# Patient Record
Sex: Female | Born: 1955 | Race: White | Hispanic: No | Marital: Married
Health system: Southern US, Community
[De-identification: ages and names within clinical notes are randomized; demographics above are authoritative.]

## PROBLEM LIST (undated history)

## (undated) DIAGNOSIS — L659 Nonscarring hair loss, unspecified: Secondary | ICD-10-CM

---

## 2015-04-10 ENCOUNTER — Encounter (HOSPITAL_BASED_OUTPATIENT_CLINIC_OR_DEPARTMENT_OTHER): Payer: Self-pay | Admitting: Emergency Medicine

## 2015-04-10 ENCOUNTER — Emergency Department (HOSPITAL_BASED_OUTPATIENT_CLINIC_OR_DEPARTMENT_OTHER)
Admission: EM | Admit: 2015-04-10 | Discharge: 2015-04-10 | Disposition: A | Discharge status: Home / Self Care | Payer: BC Managed Care – PPO | Attending: Emergency Medicine | Admitting: Emergency Medicine

## 2015-04-10 DIAGNOSIS — M79639 Pain in unspecified forearm: Secondary | ICD-10-CM

## 2015-04-10 DIAGNOSIS — X58XXXA Exposure to other specified factors, initial encounter: Secondary | ICD-10-CM | POA: Diagnosis not present

## 2015-04-10 DIAGNOSIS — Y9289 Other specified places as the place of occurrence of the external cause: Secondary | ICD-10-CM | POA: Diagnosis not present

## 2015-04-10 DIAGNOSIS — Y9389 Activity, other specified: Secondary | ICD-10-CM | POA: Diagnosis not present

## 2015-04-10 DIAGNOSIS — Z79899 Other long term (current) drug therapy: Secondary | ICD-10-CM | POA: Diagnosis not present

## 2015-04-10 DIAGNOSIS — M79631 Pain in right forearm: Secondary | ICD-10-CM | POA: Diagnosis not present

## 2015-04-10 DIAGNOSIS — M542 Cervicalgia: Secondary | ICD-10-CM | POA: Diagnosis not present

## 2015-04-10 DIAGNOSIS — Y998 Other external cause status: Secondary | ICD-10-CM | POA: Diagnosis not present

## 2015-04-10 DIAGNOSIS — T7840XA Allergy, unspecified, initial encounter: Secondary | ICD-10-CM

## 2015-04-10 DIAGNOSIS — L298 Other pruritus: Secondary | ICD-10-CM | POA: Diagnosis present

## 2015-04-10 DIAGNOSIS — M79632 Pain in left forearm: Secondary | ICD-10-CM | POA: Insufficient documentation

## 2015-04-10 HISTORY — DX: Nonscarring hair loss, unspecified: L65.9

## 2015-04-10 MED ORDER — HYDROXYZINE HCL 25 MG PO TABS
50.0000 mg | ORAL_TABLET | Freq: Once | ORAL | Status: AC
  Administered 2015-04-10: 50 mg via ORAL
  Filled 2015-04-10: qty 2

## 2015-04-10 MED ORDER — HYDROXYZINE HCL 25 MG PO TABS: 25.0000 mg | ORAL_TABLET | Freq: Four times a day (QID) | ORAL | Status: AC

## 2015-04-10 MED ORDER — PREDNISONE 20 MG PO TABS: 40.0000 mg | ORAL_TABLET | Freq: Every day | ORAL | Status: AC

## 2015-04-10 MED ORDER — PREDNISONE 50 MG PO TABS
60.0000 mg | ORAL_TABLET | Freq: Once | ORAL | Status: AC
  Administered 2015-04-10: 60 mg via ORAL
  Filled 2015-04-10 (×2): qty 1

## 2015-04-10 NOTE — ED Notes (Signed)
Pt ambulating independently w/ steady gait on d/c in no acute distress, A&Ox4. D/c instructions reviewed w/ pt and family - pt and family deny any further questions or concerns at present. Rx given x2  

## 2015-04-10 NOTE — Discharge Instructions (Signed)
Allergies  Allergies may happen from anything your body is sensitive to. This may be food, medicines, pollens, chemicals, and many other things. Food allergies can be severe and deadly.  HOME CARE  If you do not know what causes a reaction, keep a diary. Write down the foods you ate and the symptoms that followed. Avoid foods that cause reactions.  If you have red raised spots (hives) or a rash:  Take medicine as told by your doctor.  Use medicines for red raised spots and itching as needed.  Apply cold cloths (compresses) to the skin. Take a cool bath. Avoid hot baths or showers.  If you are severely allergic:  It is often necessary to go to the hospital after you have treated your reaction.  Wear your medical alert jewelry.  You and your family must learn how to give a allergy shot or use an allergy kit (anaphylaxis kit).  Always carry your allergy kit or shot with you. Use this medicine as told by your doctor if a severe reaction is occurring. GET HELP RIGHT AWAY IF:  You have trouble breathing or are making high-pitched whistling sounds (wheezing).  You have a tight feeling in your chest or throat.  You have a puffy (swollen) mouth.  You have red raised spots, puffiness (swelling), or itching all over your body.  You have had a severe reaction that was helped by your allergy kit or shot. The reaction can return once the medicine has worn off.  You think you are having a food allergy. Symptoms most often happen within 30 minutes of eating a food.  Your symptoms have not gone away within 2 days or are getting worse.  You have new symptoms.  You want to retest yourself with a food or drink you think causes an allergic reaction. Only do this under the care of a doctor. MAKE SURE YOU:   Understand these instructions.  Will watch your condition.  Will get help right away if you are not doing well or get worse. Document Released: 12/20/2012 Document Reviewed:  12/20/2012 [facility] Patient Information 2015 [place]. This information is not intended to replace advice given to you by your health care provider. Make sure you discuss any questions you have with your health care provider.

## 2015-04-10 NOTE — ED Provider Notes (Signed)
CSN: [id number]     Arrival date & time 04/10/15  0201 History   First MD Initiated Contact with Patient 04/10/15 [phone]     Chief Complaint  Patient presents with  . Pruritis     (Consider location/radiation/quality/duration/timing/severity/associated sxs/prior Treatment) HPI Comments: Patient is a 59 year old healthy female who presents today with severe itching, burning and pain to bilateral dorsal forearms and cheeks. This started about 9:00 this evening and has persisted but waxes and wanes in severity. She attempted to take Benadryl use Sarna cream and wash with doesn't dial soap without any improvement. She has not noticed any rashes on the skin but does note some slight redness and swelling of her cheeks by her nose. She was washing windows today and she had her bilateral arms and a bucket of water and thinks there may have been insect repellent on the windows. She has never had a reaction like this in the past.  The history is provided by the patient.    Past Medical History  Diagnosis Date  . Alopecia    No past surgical history on file. No family history on file. History  Substance Use Topics  . Smoking status: Not on file  . Smokeless tobacco: Not on file  . Alcohol Use: Not on file   OB History    No data available     Review of Systems  All other systems reviewed and are negative.     Allergies  Erythromycin  Home Medications   Prior to Admission medications   Medication Sig Start Date End Date Taking? Authorizing Provider  finasteride (PROSCAR) 5 MG tablet Take 5 mg by mouth daily.   Yes Historical Provider, MD  fluocinolone (VANOS) 0.01 % cream Apply topically 2 (two) times daily.   Yes Historical Provider, MD  hydroxychloroquine (PLAQUENIL) 200 MG tablet Take 200 mg by mouth daily.   Yes Historical Provider, MD   BP 129/70 mmHg  Pulse 69  Temp(Src) 98 F (36.7 C) (Oral)  Resp 16  Ht 5\' 3"  (1.6 m)  Wt 154 lb (69.854 kg)  BMI 27.29 kg/m2  SpO2  100% Physical Exam  Constitutional: She is oriented to person, place, and time. She appears well-developed and well-nourished. No distress.  HENT:  Head: Normocephalic and atraumatic.  Mild swelling and redness of the cheeks near the nose. No mouth swelling tongue and uvula are within normal limits.  Eyes: EOM are normal. Pupils are equal, round, and reactive to light.  Neck: Spinous process tenderness present.    Minimal midline cervical tenderness  Cardiovascular: Normal rate and intact distal pulses.   Pulmonary/Chest: Effort normal.  Musculoskeletal: She exhibits no edema or tenderness.  Neurological: She is alert and oriented to person, place, and time.  5 out of 5 hand grip strength  Skin: Skin is warm and dry. No rash noted. No erythema.  Psychiatric: She has a normal mood and affect. Her behavior is normal.  Nursing note and vitals reviewed.   ED Course  Procedures (including critical care time) Labs Review Labs Reviewed - No data to display  Imaging Review No results found.   EKG Interpretation None      MDM   Final diagnoses:  Pain in forearm, unspecified laterality  Allergic reaction, initial encounter    Patient presenting today with the persistent itching, burning and stinging pain in the bilateral dorsal forearms as well as some symptoms in her face. This started at 9 PM and is not resolved despite trying [place]  cream, Benadryl and washing with various soaps. She has not noticed any rashes to the skin. She's never had symptoms like this before. She does note today she was washing windows for several hours outside and did come into contact with some type of insect repellent that was on the windows. She's concerned that that could be the cause of her symptoms. This is possible however patient does not have a rash on her forearms however the symptoms are only on the arms and the face. She thinks she was wiping her face with her hands while doing this activity.  It  is possible this is allergic reaction however patient also has known cervical disease with arthritis and its also possible that she has a mild bulging disc which is causing nerve pain and a sensation of itching in her forearms.  Patient given prednisone and Atarax    Gwyneth Sprout, MD 04/10/15 [phone]

## 2015-04-10 NOTE — ED Notes (Signed)
Pt reports that she started itching on face and bilateral arms around 2130. Reports helping friend was windows tonight with "norwex" around 1730 , pt came home took shower and around 2100 started itching, took benadryl at 2200 with no help

## 2015-04-10 NOTE — ED Notes (Signed)
Pt with redness to right cheek, c/o itching to bilateral arms, very anxious, denies sob, no difficulty talking or swallowing

## 2016-12-25 ENCOUNTER — Emergency Department (HOSPITAL_BASED_OUTPATIENT_CLINIC_OR_DEPARTMENT_OTHER)
Admission: EM | Admit: 2016-12-25 | Discharge: 2016-12-25 | Disposition: A | Discharge status: Home / Self Care | Payer: BC Managed Care – PPO | Attending: Emergency Medicine | Admitting: Emergency Medicine

## 2016-12-25 ENCOUNTER — Emergency Department (HOSPITAL_BASED_OUTPATIENT_CLINIC_OR_DEPARTMENT_OTHER): Payer: BC Managed Care – PPO

## 2016-12-25 DIAGNOSIS — M791 Myalgia: Secondary | ICD-10-CM | POA: Diagnosis not present

## 2016-12-25 DIAGNOSIS — M25551 Pain in right hip: Secondary | ICD-10-CM | POA: Diagnosis present

## 2016-12-25 DIAGNOSIS — M549 Dorsalgia, unspecified: Secondary | ICD-10-CM | POA: Insufficient documentation

## 2016-12-25 DIAGNOSIS — Z79899 Other long term (current) drug therapy: Secondary | ICD-10-CM | POA: Insufficient documentation

## 2016-12-25 DIAGNOSIS — R110 Nausea: Secondary | ICD-10-CM | POA: Diagnosis not present

## 2016-12-25 DIAGNOSIS — R52 Pain, unspecified: Secondary | ICD-10-CM

## 2016-12-25 MED ORDER — HYDROCODONE-ACETAMINOPHEN 5-325 MG PO TABS: 1.0000 | ORAL_TABLET | Freq: Four times a day (QID) | ORAL | 0 refills | Status: AC | PRN

## 2016-12-25 MED ORDER — HYDROCODONE-ACETAMINOPHEN 5-325 MG PO TABS
1.0000 | ORAL_TABLET | Freq: Once | ORAL | Status: AC
  Administered 2016-12-25: 1 via ORAL
  Filled 2016-12-25: qty 1

## 2016-12-25 NOTE — ED Notes (Signed)
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

## 2016-12-25 NOTE — ED Provider Notes (Signed)
[facility] Provider Note   CSN: [id number] Arrival date & time: 12/25/16  1913  By signing my name below, I, Pamela Jones, attest that this documentation has been prepared under the direction and in the presence of physician practitioner, Vanetta Mulders, MD. Electronically Signed: Linna Jones, Scribe. 12/25/2016. 9:50 PM.  History   Chief Complaint Chief Complaint  Patient presents with  . Hip Pain    The history is provided by the patient. No language interpreter was used.  Hip Pain  This is a new problem. The current episode started more than 1 week ago. The problem occurs constantly. The problem has been gradually worsening. Pertinent negatives include no chest pain, no abdominal pain, no headaches and no shortness of breath. The symptoms are aggravated by walking and standing. Nothing relieves the symptoms. She has tried nothing for the symptoms.     HPI Comments: Pamela Jones is a 61 y.o. female who presents to the Emergency Department complaining of constant, gradually worsening right hip pain for a couple of weeks. She reports associated nausea secondary to pain. Pt states her pain worsened significantly tonight while ambulating and notes she cannot currently ambulate due to the severity of her pain. She reports pain radiation down her right lower extremity. No alleviating factors noted. Pt received a steroid injection from her PCP on 4/10 with mild improvement of her hip pain. She also notes some pre-existing back pain that has been worse since onset of her hip pain. Pt denies numbness/tingling, fevers, chills, visual changes, cough, rhinorrhea, sore throat, CP, SOB, abdominal pain, vomiting, diarrhea, dysuria, joint swelling, rashes, headaches, or any other associated symptoms.  Past Medical History:  Diagnosis Date  . Alopecia     There are no active problems to display for this patient.   No past surgical history on file.  OB History    No data available         Home Medications    Prior to Admission medications   Medication Sig Start Date End Date Taking? Authorizing Provider  finasteride (PROSCAR) 5 MG tablet Take 5 mg by mouth daily.   Yes Historical Provider, MD  hydroxychloroquine (PLAQUENIL) 200 MG tablet Take 200 mg by mouth daily.   Yes Historical Provider, MD  fluocinolone (VANOS) 0.01 % cream Apply topically 2 (two) times daily.    Historical Provider, MD  HYDROcodone-acetaminophen (NORCO/VICODIN) 5-325 MG tablet Take 1-2 tablets by mouth every 6 (six) hours as needed for moderate pain. 12/25/16   Vanetta Mulders, MD  hydrOXYzine (ATARAX/VISTARIL) 25 MG tablet Take 1 tablet (25 mg total) by mouth every 6 (six) hours. 04/10/15   Gwyneth Sprout, MD  predniSONE (DELTASONE) 20 MG tablet Take 2 tablets (40 mg total) by mouth daily. 04/10/15   Gwyneth Sprout, MD    Family History No family history on file.  Social History Social History  Substance Use Topics  . Smoking status: Not on file  . Smokeless tobacco: Not on file  . Alcohol use Not on file     Allergies   Erythromycin   Review of Systems Review of Systems  Constitutional: Negative for chills and fever.  HENT: Negative for rhinorrhea and sore throat.   Eyes: Negative for visual disturbance.  Respiratory: Negative for cough and shortness of breath.   Cardiovascular: Negative for chest pain.  Gastrointestinal: Positive for nausea. Negative for abdominal pain, diarrhea and vomiting.  Genitourinary: Negative for dysuria.  Musculoskeletal: Positive for back pain, gait problem and myalgias. Negative for joint  swelling.  Skin: Negative for rash.  Neurological: Negative for numbness and headaches.  Hematological: Does not bruise/bleed easily.  Psychiatric/Behavioral: Negative for confusion.   Physical Exam Updated Vital Signs BP (!) 120/52 (BP Location: Right Arm)   Pulse 79   Temp 98.2 F (36.8 C) (Oral)   Resp 16   SpO2 97%   Physical Exam  Constitutional:  She is oriented to person, place, and time. She appears well-developed and well-nourished. No distress.  HENT:  Head: Normocephalic and atraumatic.  Mouth/Throat: Oropharynx is clear and moist.  Eyes: Conjunctivae and EOM are normal. Pupils are equal, round, and reactive to light. No scleral icterus.  Neck: Neck supple. No tracheal deviation present.  Cardiovascular: Normal rate and regular rhythm.   Pulmonary/Chest: Effort normal and breath sounds normal. No respiratory distress.  Abdominal: Soft. Bowel sounds are normal. There is no tenderness.  Musculoskeletal: Normal range of motion. She exhibits no edema.  No effusion in the right knee. Rotation of the right leg causes pain in the right hip.  Neurological: She is alert and oriented to person, place, and time.  Skin: Skin is warm and dry.  Psychiatric: She has a normal mood and affect. Her behavior is normal.  Nursing note and vitals reviewed.  ED Treatments / Results  Labs (all labs ordered are listed, but only abnormal results are displayed) Labs Reviewed - No data to display  EKG  EKG Interpretation None       Radiology Dg Hip Unilat With Pelvis 2-3 Views Right  Result Date: 12/25/2016 CLINICAL DATA:  Right hip pain for 1 week. Steroid injection 1 week prior with some resolution, pain has worsened. EXAM: DG HIP (WITH OR WITHOUT PELVIS) 2-3V RIGHT COMPARISON:  None. FINDINGS: The cortical margins of the bony pelvis and right hip are intact. No fracture. Pubic symphysis and sacroiliac joints are congruent. Both femoral heads are well-seated in the respective acetabula. No evidence of focal lesion. Well-defined calcific densities adjacent to the right greater trochanter. IMPRESSION: Soft tissue calcifications adjacent to the right greater trochanter may be enthesopathy versus calcific bursitis. No osseous abnormality. Electronically Signed   By: Rubye Oaks M.D.   On: 12/25/2016 20:00    Procedures Procedures (including  critical care time)  DIAGNOSTIC STUDIES: Oxygen Saturation is 97% on RA, normal by my interpretation.    COORDINATION OF CARE: 10:06 PM Discussed treatment plan with pt at bedside and pt agreed to plan.  Medications Ordered in ED Medications  HYDROcodone-acetaminophen (NORCO/VICODIN) 5-325 MG per tablet 1 tablet (not administered)     Initial Impression / Assessment and Plan / ED Course  I have reviewed the triage vital signs and the nursing notes.  Pertinent labs & imaging results that were available during my care of the patient were reviewed by me and considered in my medical decision making (see chart for details).    Patient been having some difficulty with right hip pain. Pain seems to be mostly right hip. Patient had an injection of steroids in that area on April 10 with some improvement but now worsening pain. X-rays negative for any acute bony injuries. Will treat with pain medicine and have patient follow back up with primary care doctor and/or orthopedics.  Right leg with good blood flow to the right foot. Does have some increased pain with range of motion of the right hip.  Patient does have some back pain. Pain does radiate from back greater in the right hip and then down thigh and pertinent  to the leg. No focal neural deficits in the foot. Doubt that this is a sciatica kind of back pain. But it is possible.   Final Clinical Impressions(s) / ED Diagnoses   Final diagnoses:  Right hip pain    New Prescriptions New Prescriptions   HYDROCODONE-ACETAMINOPHEN (NORCO/VICODIN) 5-325 MG TABLET    Take 1-2 tablets by mouth every 6 (six) hours as needed for moderate pain.   I personally performed the services described in this documentation, which was scribed in my presence. The recorded information has been reviewed and is accurate.      Vanetta Mulders, MD 12/25/16 2220

## 2016-12-25 NOTE — ED Notes (Signed)
Pt. Has some edema noted in the R leg just above the Knee and has 2-3 small bruises green and brown and purple in color.  Pt. Reports a lot of pian in the R hip without injury.

## 2016-12-25 NOTE — ED Notes (Signed)
Three new warm packs given to Pt

## 2016-12-25 NOTE — ED Notes (Signed)
Pt. Just returned from Radiology 

## 2016-12-25 NOTE — Discharge Instructions (Signed)
Breasts the right hip and leg is much as possible. Take pain medicine as directed. Follow-up with your doctor and also orthopedics. Work note provided to be out of work tomorrow so that she can rest the leg. Return for any new or worse symptoms. X-rays negative for any bony injuries.

## 2016-12-25 NOTE — ED Triage Notes (Signed)
Right hip pain a couple of weeks ago. She was seen and given a steroid injection for bursitis. Her pain was better today while at work then tonight the pain got worse.

## 2017-12-17 IMAGING — CR DG HIP (WITH OR WITHOUT PELVIS) 2-3V*R*
3 series · 3 of 3 positions shown · non-contrast
Comparison: None.

CLINICAL DATA: Right hip pain for 1 week. Steroid injection 1 week
prior with some resolution, pain has worsened.

EXAM:
DG HIP (WITH OR WITHOUT PELVIS) 2-3V RIGHT

[t pelvis a.p.]
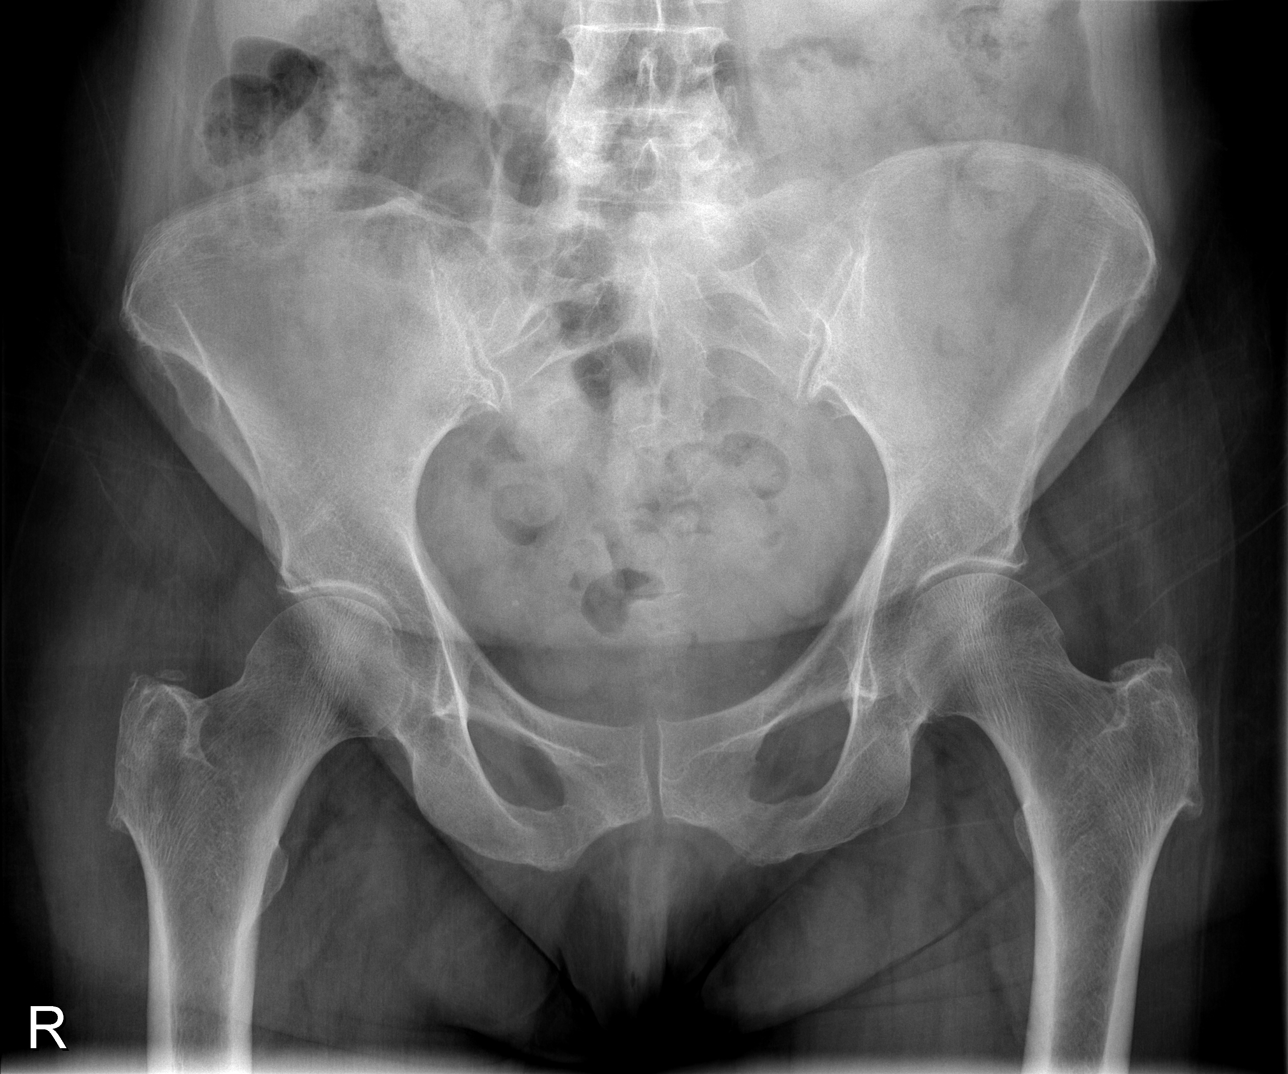

[t hip ap right]
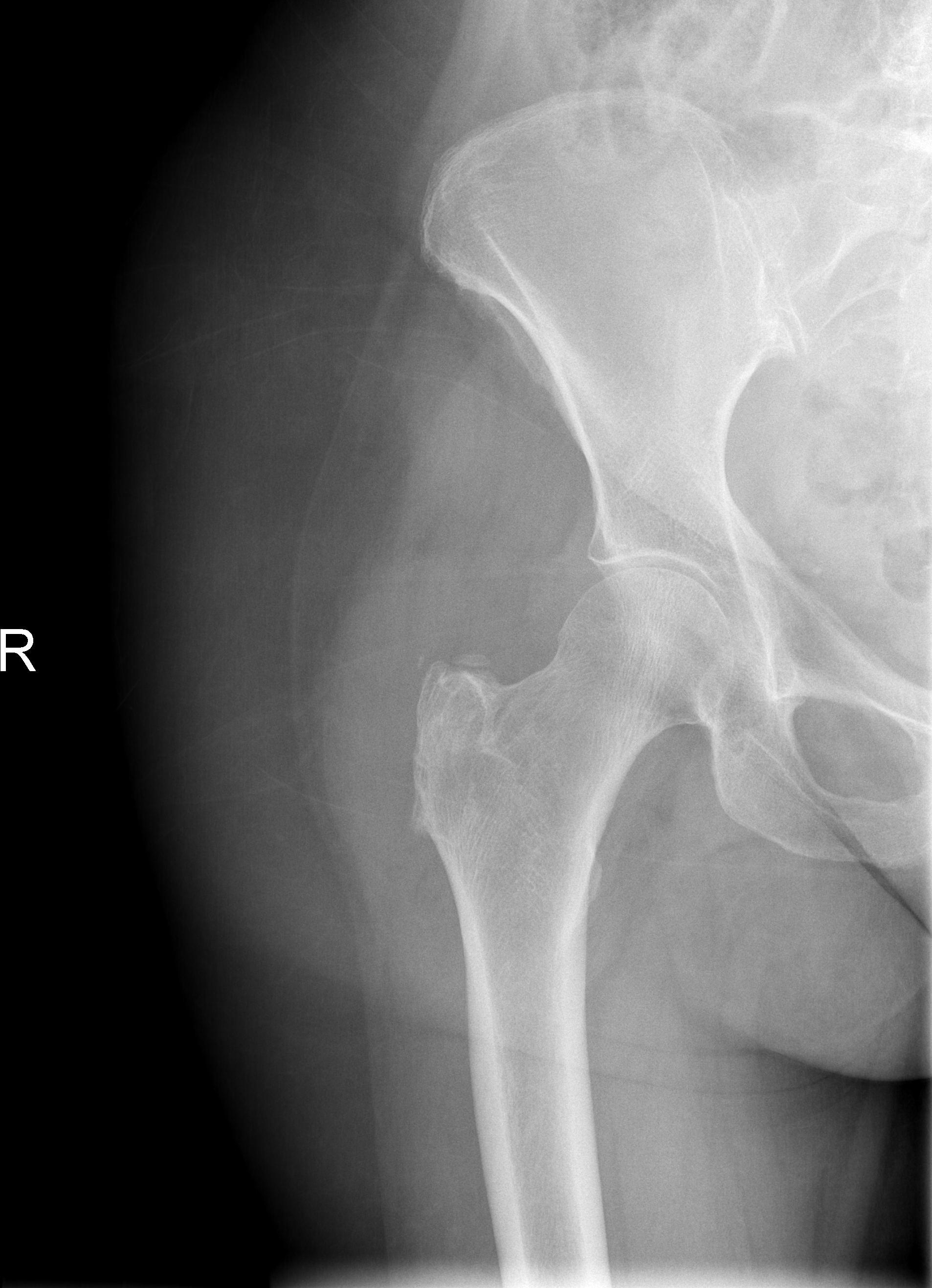

[t hip frog leg right]
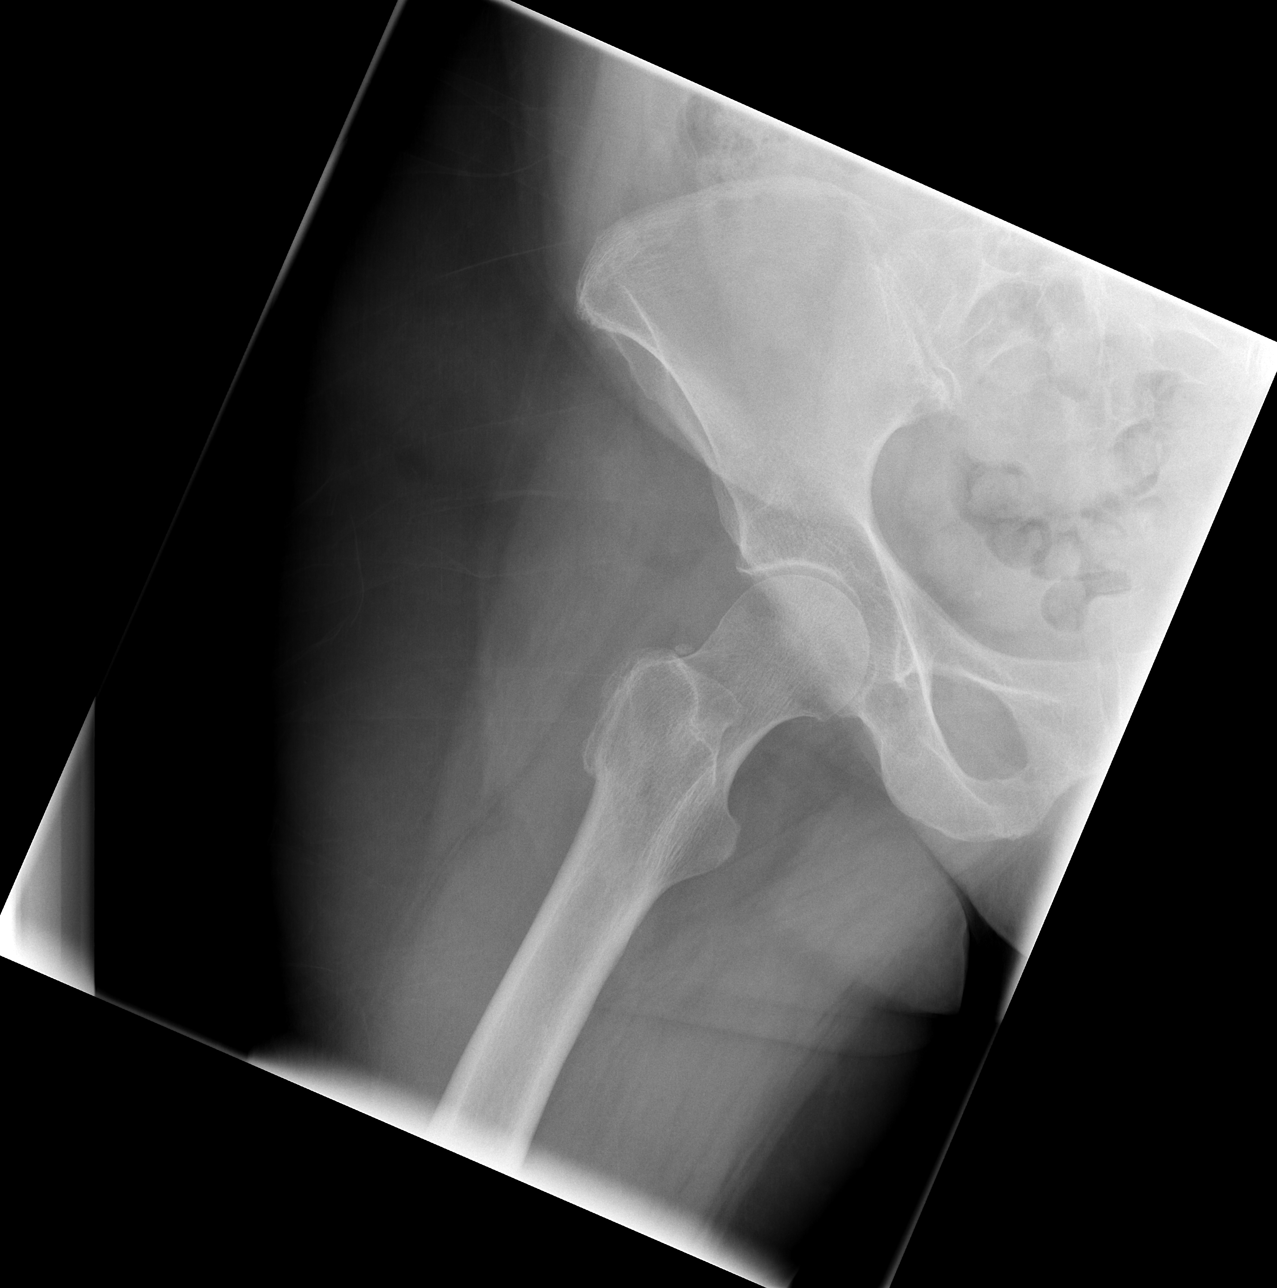

[3 of 3 positions shown; findings below may reference images not displayed]

FINDINGS: The cortical margins of the bony pelvis and right hip are intact. No
fracture. Pubic symphysis and sacroiliac joints are congruent. Both
femoral heads are well-seated in the respective acetabula. No
evidence of focal lesion. Well-defined calcific densities adjacent
to the right greater trochanter.
IMPRESSION: Soft tissue calcifications adjacent to the right greater trochanter
may be enthesopathy versus calcific bursitis.

No osseous abnormality.

## 2023-01-28 ENCOUNTER — Other Ambulatory Visit (HOSPITAL_COMMUNITY): Payer: Self-pay | Admitting: Interventional Radiology

## 2023-01-28 DIAGNOSIS — S32050A Wedge compression fracture of fifth lumbar vertebra, initial encounter for closed fracture: Secondary | ICD-10-CM

## 2023-01-28 DIAGNOSIS — S32040A Wedge compression fracture of fourth lumbar vertebra, initial encounter for closed fracture: Secondary | ICD-10-CM

## 2023-01-28 DIAGNOSIS — S32020A Wedge compression fracture of second lumbar vertebra, initial encounter for closed fracture: Secondary | ICD-10-CM

## 2023-02-09 ENCOUNTER — Ambulatory Visit (HOSPITAL_COMMUNITY)
Admission: RE | Admit: 2023-02-09 | Discharge: 2023-02-09 | Disposition: A | Discharge status: Home / Self Care | Payer: Medicare PPO | Source: Ambulatory Visit | Attending: Interventional Radiology | Admitting: Interventional Radiology

## 2023-02-09 DIAGNOSIS — S32040A Wedge compression fracture of fourth lumbar vertebra, initial encounter for closed fracture: Secondary | ICD-10-CM

## 2023-02-09 DIAGNOSIS — S32020A Wedge compression fracture of second lumbar vertebra, initial encounter for closed fracture: Secondary | ICD-10-CM

## 2023-02-09 DIAGNOSIS — S32050A Wedge compression fracture of fifth lumbar vertebra, initial encounter for closed fracture: Secondary | ICD-10-CM

## 2023-02-10 HISTORY — PX: IR RADIOLOGIST EVAL & MGMT: IMG5224

## 2024-03-04 ENCOUNTER — Encounter (HOSPITAL_COMMUNITY): Payer: Self-pay | Admitting: Interventional Radiology
